# Patient Record
Sex: Female | Born: 2000 | Race: White | Hispanic: No | Marital: Married | Smoking: Never smoker
Health system: Southern US, Community
[De-identification: ages and names within clinical notes are randomized; demographics above are authoritative.]

## PROBLEM LIST (undated history)

## (undated) DIAGNOSIS — F988 Other specified behavioral and emotional disorders with onset usually occurring in childhood and adolescence: Secondary | ICD-10-CM

---
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
## 2021-02-13 ENCOUNTER — Ambulatory Visit (INDEPENDENT_AMBULATORY_CARE_PROVIDER_SITE_OTHER): Payer: Medicaid Other | Admitting: Adult Health

## 2021-02-13 ENCOUNTER — Other Ambulatory Visit: Payer: Self-pay

## 2021-02-13 ENCOUNTER — Encounter: Payer: Self-pay | Admitting: Adult Health

## 2021-02-13 VITALS — BP 127/80 | HR 81 | Ht 61.0 in | Wt 121.2 lb

## 2021-02-13 DIAGNOSIS — O3680X0 Pregnancy with inconclusive fetal viability, not applicable or unspecified: Secondary | ICD-10-CM | POA: Diagnosis not present

## 2021-02-13 DIAGNOSIS — Z3201 Encounter for pregnancy test, result positive: Secondary | ICD-10-CM

## 2021-02-13 DIAGNOSIS — Z3A01 Less than 8 weeks gestation of pregnancy: Secondary | ICD-10-CM | POA: Diagnosis not present

## 2021-02-13 DIAGNOSIS — Z3200 Encounter for pregnancy test, result unknown: Secondary | ICD-10-CM | POA: Insufficient documentation

## 2021-02-13 LAB — POCT URINE PREGNANCY: Preg Test, Ur: POSITIVE — AB

## 2021-02-13 NOTE — Patient Instructions (Signed)
Obstetrics: Normal and Problem Pregnancies (7th ed., pp. 102-121). [place], [place]: Elsevier."> Textbook of Family Medicine (9th ed., pp. 365-410). [place], [place]: [employer].">  First Trimester of Pregnancy  The first trimester of pregnancy starts on the first day of your last menstrual period until the end of week 12. This is months 1 through 3 of pregnancy. A week after a sperm fertilizes an egg, the egg will implant into the wall of the uterus and begin to develop into a baby. By the end of 12 weeks, all the baby's organs will be formed and the baby will be 2-3 inches in size. Body changes during your first trimester Your body goes through many changes during pregnancy. The changes vary and generally return to normal after your baby is born. Physical changes  You may gain or lose weight.  Your breasts may begin to grow larger and become tender. The tissue that surrounds your nipples (areola) may become darker.  Dark spots or blotches (chloasma or mask of pregnancy) may develop on your face.  You may have changes in your hair. These can include thickening or thinning of your hair or changes in texture. Health changes  You may feel nauseous, and you may vomit.  You may have heartburn.  You may develop headaches.  You may develop constipation.  Your gums may bleed and may be sensitive to brushing and flossing. Other changes  You may tire easily.  You may urinate more often.  Your menstrual periods will stop.  You may have a loss of appetite.  You may develop cravings for certain kinds of food.  You may have changes in your emotions from day to day.  You may have more vivid and strange dreams. Follow these instructions at home: Medicines  Follow your health care provider's instructions regarding medicine use. Specific medicines may be either safe or unsafe to take during pregnancy. Do not take any medicines unless told to by your health care provider.  Take a  prenatal vitamin that contains at least 600 micrograms (mcg) of folic acid. Eating and drinking  Eat a healthy diet that includes fresh fruits and vegetables, whole grains, good sources of protein such as meat, eggs, or tofu, and low-fat dairy products.  Avoid raw meat and unpasteurized juice, milk, and cheese. These carry germs that can harm you and your baby.  If you feel nauseous or you vomit: ? Eat 4 or 5 small meals a day instead of 3 large meals. ? Try eating a few soda crackers. ? Drink liquids between meals instead of during meals.  You may need to take these actions to prevent or treat constipation: ? Drink enough fluid to keep your urine pale yellow. ? Eat foods that are high in fiber, such as beans, whole grains, and fresh fruits and vegetables. ? Limit foods that are high in fat and processed sugars, such as fried or sweet foods. Activity  Exercise only as directed by your health care provider. Most people can continue their usual exercise routine during pregnancy. Try to exercise for 30 minutes at least 5 days a week.  Stop exercising if you develop pain or cramping in the lower abdomen or lower back.  Avoid exercising if it is very hot or humid or if you are at high altitude.  Avoid heavy lifting.  If you choose to, you may have sex unless your health care provider tells you not to. Relieving pain and discomfort  Wear a good support bra to relieve breast   tenderness.  Rest with your legs elevated if you have leg cramps or low back pain.  If you develop bulging veins (varicose veins) in your legs: ? Wear support hose as told by your health care provider. ? Elevate your feet for 15 minutes, 3-4 times a day. ? Limit salt in your diet. Safety  Wear your seat belt at all times when driving or riding in a car.  Talk with your health care provider if someone is verbally or physically abusive to you.  Talk with your health care provider if you are feeling sad or have  thoughts of hurting yourself. Lifestyle  Do not use hot tubs, steam rooms, or saunas.  Do not douche. Do not use tampons or scented sanitary pads.  Do not use herbal remedies, alcohol, illegal drugs, or medicines that are not approved by your health care provider. Chemicals in these products can harm your baby.  Do not use any products that contain nicotine or tobacco, such as cigarettes, e-cigarettes, and chewing tobacco. If you need help quitting, ask your health care provider.  Avoid cat litter boxes and soil used by cats. These carry germs that can cause birth defects in the baby and possibly loss of the unborn baby (fetus) by miscarriage or stillbirth. General instructions  During routine prenatal visits in the first trimester, your health care provider will do a physical exam, perform necessary tests, and ask you how things are going. Keep all follow-up visits. This is important.  Ask for help if you have counseling or nutritional needs during pregnancy. Your health care provider can offer advice or refer you to specialists for help with various needs.  Schedule a dentist appointment. At home, brush your teeth with a soft toothbrush. Floss gently.  Write down your questions. Take them to your prenatal visits. Where to find more information  American Pregnancy Association: americanpregnancy.org  American College of Obstetricians and Gynecologists: acog.org/en/Womens%20Health/Pregnancy  Office on Women's Health: womenshealth.gov/pregnancy Contact a health care provider if you have:  Dizziness.  A fever.  Mild pelvic cramps, pelvic pressure, or nagging pain in the abdominal area.  Nausea, vomiting, or diarrhea that lasts for 24 hours or longer.  A bad-smelling vaginal discharge.  Pain when you urinate.  Known exposure to a contagious illness, such as chickenpox, measles, Zika virus, HIV, or hepatitis. Get help right away if you have:  Spotting or bleeding from your  vagina.  Severe abdominal cramping or pain.  Shortness of breath or chest pain.  Any kind of trauma, such as from a fall or a car crash.  New or increased pain, swelling, or redness in an arm or leg. Summary  The first trimester of pregnancy starts on the first day of your last menstrual period until the end of week 12 (months 1 through 3).  Eating 4 or 5 small meals a day rather than 3 large meals may help to relieve nausea and vomiting.  Do not use any products that contain nicotine or tobacco, such as cigarettes, e-cigarettes, and chewing tobacco. If you need help quitting, ask your health care provider.  Keep all follow-up visits. This is important. This information is not intended to replace advice given to you by your health care provider. Make sure you discuss any questions you have with your health care provider. Document Revised: 02/28/2020 Document Reviewed: 01/04/2020 Elsevier Patient Education  2021 Elsevier Inc.  

## 2021-02-13 NOTE — Progress Notes (Signed)
  Subjective:     Patient ID: Lauren Savage, female   DOB: 02/02/2001, 20 y.o.   MRN: [id number]  HPI Tonja is a 20 year old white female, single with SO,originally had appt to discuss fertility and then a week after making appt. Found out she was pregnant. PCP is Dr Laural Benes.   Review of Systems +missed period with +HPTS of 8 or 9 Reviewed past medical,surgical, social and family history. Reviewed medications and allergies.     Objective:   Physical Exam BP 127/80 (BP Location: Right Arm, Patient Position: Sitting, Cuff Size: Normal)   Pulse 81   Ht 5\' 1"  (1.549 m)   Wt 121 lb 3.2 oz (55 kg)   LMP 01/12/2021 (Approximate)   BMI 22.90 kg/m UPT +. About 4+4 weeks by LMP with EDD 10/19/21.Skin warm and dry. Neck: mid line trachea, normal thyroid, good ROM, no lymphadenopathy noted. Lungs: clear to ausculation bilaterally. Cardiovascular: regular rate and rhythm.Abdomen is soft and non tender.  Upstream - 02/13/21 1534      Pregnancy Intention Screening   Does the patient want to become pregnant in the next year? Yes    Does the patient's partner want to become pregnant in the next year? Yes    Would the patient like to discuss contraceptive options today? No      Contraception Wrap Up   Current Method Pregnant/Seeking Pregnancy    End Method Pregnant/Seeking Pregnancy    Contraception Counseling Provided No             Assessment:     1. Possible pregnancy +UPT  2. Less than [redacted] weeks gestation of pregnancy Take OTC PNV  3. Encounter to determine fetal viability of pregnancy, single or unspecified fetus Return in about 4 weeks for dating 04/15/21    Plan:      -note given to limit lifting to <25 lbs, she works for [employer] Review handout on First trimester and by [facility]

## 2021-02-19 ENCOUNTER — Other Ambulatory Visit: Payer: Self-pay

## 2021-02-19 ENCOUNTER — Encounter: Payer: Self-pay | Admitting: *Deleted

## 2021-02-19 ENCOUNTER — Other Ambulatory Visit (INDEPENDENT_AMBULATORY_CARE_PROVIDER_SITE_OTHER): Payer: Medicaid Other | Admitting: *Deleted

## 2021-02-19 DIAGNOSIS — R319 Hematuria, unspecified: Secondary | ICD-10-CM | POA: Diagnosis not present

## 2021-02-19 LAB — POCT URINALYSIS DIPSTICK OB
Blood, UA: NEGATIVE
Glucose, UA: NEGATIVE
Leukocytes, UA: NEGATIVE
Nitrite, UA: NEGATIVE
POC,PROTEIN,UA: NEGATIVE

## 2021-02-19 MED ORDER — PROMETHAZINE HCL 25 MG PO TABS: 25.0000 mg | ORAL_TABLET | Freq: Four times a day (QID) | ORAL | 1 refills | Status: DC | PRN

## 2021-02-19 NOTE — Addendum Note (Signed)
Addended by: Cyril Mourning A on: 02/19/2021 05:14 PM   Modules accepted: Orders

## 2021-02-19 NOTE — Progress Notes (Signed)
   NURSE VISIT- UTI SYMPTOMS   SUBJECTIVE:  Lauren Savage is a 20 y.o. G63P0000 female here for UTI symptoms. She is [redacted]w[redacted]d pregnant. She reports flank pain on the right and hematuria.  C/O worsening nausea and vomiting.  Sleeping a lot.  OBJECTIVE:  LMP 01/12/2021 (Approximate)   Appears well, in no apparent distress  Results for orders placed or performed in visit on 02/19/21 (from the past 24 hour(s))  POC Urinalysis Dipstick OB   Collection Time: 02/19/21  4:28 PM  Result Value Ref Range   Color, UA     Clarity, UA     Glucose, UA Negative Negative   Bilirubin, UA     Ketones, UA small    Spec Grav, UA     Blood, UA neg    pH, UA     POC,PROTEIN,UA Negative Negative, Trace, Small (1+), Moderate (2+), Large (3+), 4+   Urobilinogen, UA     Nitrite, UA neg    Leukocytes, UA Negative Negative   Appearance     Odor      ASSESSMENT: Pregnancy [redacted]w[redacted]d with UTI symptoms and negative nitrites  PLAN: Note routed to Cyril Mourning, AGNP   Rx sent by provider today: Yes  For nausea and vomiting. Will wait for culture results.  Urine culture sent Call or return to clinic prn if these symptoms worsen or fail to improve as anticipated. Follow-up: as scheduled   Jobe Marker  02/19/2021 4:28 PM

## 2021-02-19 NOTE — Progress Notes (Signed)
Chart reviewed for nurse visit. Agree with plan of care. Rx phenergan Adline Potter, NP 02/19/2021 5:13 PM

## 2021-02-20 ENCOUNTER — Telehealth: Payer: Self-pay | Admitting: *Deleted

## 2021-02-20 NOTE — Telephone Encounter (Signed)
Verbal order called in for Phenergan 25mg  PO every 6 hours.

## 2021-02-21 LAB — URINE CULTURE: Organism ID, Bacteria: NO GROWTH

## 2021-03-19 ENCOUNTER — Other Ambulatory Visit: Payer: Medicaid Other

## 2021-04-14 ENCOUNTER — Encounter: Payer: Self-pay | Admitting: Pediatrics

## 2021-05-22 ENCOUNTER — Ambulatory Visit: Payer: Self-pay | Admitting: Adult Health

## 2021-09-01 ENCOUNTER — Ambulatory Visit (INDEPENDENT_AMBULATORY_CARE_PROVIDER_SITE_OTHER): Payer: Medicaid Other | Admitting: Adult Health

## 2021-09-01 ENCOUNTER — Other Ambulatory Visit: Payer: Self-pay

## 2021-09-01 ENCOUNTER — Encounter: Payer: Self-pay | Admitting: Adult Health

## 2021-09-01 VITALS — BP 115/70 | HR 80 | Ht 62.0 in | Wt 114.5 lb

## 2021-09-01 DIAGNOSIS — Z3201 Encounter for pregnancy test, result positive: Secondary | ICD-10-CM | POA: Diagnosis not present

## 2021-09-01 DIAGNOSIS — Z3A01 Less than 8 weeks gestation of pregnancy: Secondary | ICD-10-CM

## 2021-09-01 LAB — POCT URINE PREGNANCY: Preg Test, Ur: POSITIVE — AB

## 2021-09-01 NOTE — Progress Notes (Signed)
  Subjective:     Patient ID: Lauren Savage, female   DOB: 2001-05-12, 20 y.o.   MRN: [id number]  HPI Lauren Savage is a 20 year old white female, married, in for UPT, she has had missed periods and had 3+HPTs. She said she was treated for ectopic pregnancy in [facility] in [place], with a shot,and got pregnant shortly after.  Her mom is with her today.  Review of Systems Has missed periods and had 3+HPTs +urinary frequency and breast tenderness Reviewed past medical,surgical, social and family history. Reviewed medications and allergies.     Objective:   Physical Exam BP 115/70 (BP Location: Left Arm, Patient Position: Sitting, Cuff Size: Normal)   Pulse 80   Ht 5\' 2"  (1.575 m)   Wt 114 lb 8 oz (51.9 kg)   LMP 07/13/2021 (Approximate)   BMI 20.94 kg/m  UPT is +, about 7+1 week by LMP with EDD 04/19/22. Skin warm and dry. Neck: mid line trachea, normal thyroid, good ROM, no lymphadenopathy noted. Lungs: clear to ausculation bilaterally. Cardiovascular: regular rate and rhythm.    Abdomen is soft and non tender, POC 04/21/22 shows IUP with YS and fetal pole with +FHM. Fall risk is low  Upstream - 09/01/21 1400       Pregnancy Intention Screening   Does the patient want to become pregnant in the next year? N/A    Does the patient's partner want to become pregnant in the next year? N/A    Would the patient like to discuss contraceptive options today? N/A      Contraception Wrap Up   Current Method Pregnant/Seeking Pregnancy    End Method Pregnant/Seeking Pregnancy    Contraception Counseling Provided No             Assessment:     1. Pregnancy examination or test, positive result Continue PNV  2. Less than [redacted] weeks gestation of pregnancy     Plan:     Will be seen in 09/03/21 husband in [employer]

## 2023-05-25 ENCOUNTER — Ambulatory Visit: Admitting: Adult Health

## 2023-05-25 ENCOUNTER — Encounter: Payer: Self-pay | Admitting: Adult Health

## 2023-05-25 VITALS — BP 121/78 | HR 90 | Ht 61.25 in | Wt 104.5 lb

## 2023-05-25 DIAGNOSIS — Z3202 Encounter for pregnancy test, result negative: Secondary | ICD-10-CM | POA: Diagnosis not present

## 2023-05-25 DIAGNOSIS — Z30011 Encounter for initial prescription of contraceptive pills: Secondary | ICD-10-CM | POA: Diagnosis not present

## 2023-05-25 LAB — POCT URINE PREGNANCY: Preg Test, Ur: NEGATIVE

## 2023-05-25 MED ORDER — NORETHIN-ETH ESTRAD-FE BIPHAS 1 MG-10 MCG / 10 MCG PO TABS: 1.0000 | ORAL_TABLET | Freq: Every day | ORAL | Status: DC

## 2023-05-25 NOTE — Progress Notes (Signed)
  Subjective:     Patient ID: Lauren Savage, female   DOB: 07-17-2001, 22 y.o.   MRN: [id number]  HPI Lauren Savage is a 22 year old white female, married, G2P0111, in requesting to start BCP. Periods are regular and last about 7 days with some cramps.    Review of Systems Periods are regular with some cramps Last sex 2 days ago Denies any MI.stroke. DVT, breast cancer or migraine with aura Reviewed past medical,surgical, social and family history. Reviewed medications and allergies.     Objective:   Physical Exam BP 121/78 (BP Location: Left Arm, Patient Position: Sitting, Cuff Size: Normal)   Pulse 90   Ht 5' 1.25" (1.556 m)   Wt 104 lb 8 oz (47.4 kg)   LMP 05/06/2023 (Approximate)   Breastfeeding No   BMI 19.58 kg/m  UPT is negative. Skin warm and dry.Lungs: clear to ausculation bilaterally. Cardiovascular: regular rate and rhythm.  Fall risk is low  Upstream - 05/25/23 1415       Pregnancy Intention Screening   Does the patient want to become pregnant in the next year? No    Does the patient's partner want to become pregnant in the next year? No    Would the patient like to discuss contraceptive options today? Yes      Contraception Wrap Up   Current Method No Method - Other Reason    End Method Oral Contraceptive    Contraception Counseling Provided Yes    How was the end contraceptive method provided? Provided on site                Assessment:    1. Pregnancy examination or test, negative result - POCT urine pregnancy  2. Encounter for initial prescription of contraceptive pills   Will give 3 packs lo Loestrin, start with next period and use condoms for 1 pack Will follow up in 3 months for ROS and pap and physical     Meds ordered this encounter  Medications   Norethindrone-Ethinyl Estradiol-Fe Biphas (LO LOESTRIN FE) 1 MG-10 MCG / 10 MCG tablet    Sig: Take 1 tablet by mouth daily.    Order Specific Question:   Supervising Provider    Answer:   Lazaro Arms  [[id number]]    Plan:     Return 08/25/23 for pap and physical and ROS

## 2023-06-28 ENCOUNTER — Other Ambulatory Visit (HOSPITAL_COMMUNITY)
Admission: RE | Admit: 2023-06-28 | Discharge: 2023-06-28 | Disposition: A | Discharge status: Home / Self Care | Source: Ambulatory Visit | Attending: Women's Health | Admitting: Women's Health

## 2023-06-28 ENCOUNTER — Encounter: Payer: Self-pay | Admitting: Adult Health

## 2023-06-28 ENCOUNTER — Ambulatory Visit: Admitting: Adult Health

## 2023-06-28 VITALS — BP 136/82 | Ht 62.0 in | Wt 100.0 lb

## 2023-06-28 DIAGNOSIS — Z124 Encounter for screening for malignant neoplasm of cervix: Secondary | ICD-10-CM | POA: Insufficient documentation

## 2023-06-28 DIAGNOSIS — Z30013 Encounter for initial prescription of injectable contraceptive: Secondary | ICD-10-CM | POA: Diagnosis not present

## 2023-06-28 DIAGNOSIS — Z1331 Encounter for screening for depression: Secondary | ICD-10-CM

## 2023-06-28 DIAGNOSIS — R630 Anorexia: Secondary | ICD-10-CM

## 2023-06-28 DIAGNOSIS — R634 Abnormal weight loss: Secondary | ICD-10-CM

## 2023-06-28 DIAGNOSIS — Z3202 Encounter for pregnancy test, result negative: Secondary | ICD-10-CM

## 2023-06-28 DIAGNOSIS — R4589 Other symptoms and signs involving emotional state: Secondary | ICD-10-CM | POA: Diagnosis not present

## 2023-06-28 LAB — POCT URINE PREGNANCY: Preg Test, Ur: NEGATIVE

## 2023-06-28 MED ORDER — MEDROXYPROGESTERONE ACETATE 150 MG/ML IM SUSP: 150.0000 mg | INTRAMUSCULAR | 4 refills | Status: DC

## 2023-06-28 MED ORDER — MEDROXYPROGESTERONE ACETATE 150 MG/ML IM SUSY
150.0000 mg | PREFILLED_SYRINGE | Freq: Once | INTRAMUSCULAR | Status: AC
Start: 2023-06-28 — End: 2023-06-28
  Administered 2023-06-28: 150 mg via INTRAMUSCULAR

## 2023-06-28 NOTE — Progress Notes (Signed)
Subjective:     Patient ID: Lauren Savage, female   DOB: 2001-06-03, 22 y.o.   MRN: [id number]  HPI Lauren Savage is a 22 year old white female, married, G2P0111, in to discuss birth control, stopped lo Loestrin was moody and lost weight, no appetite. And she needs a pap.  She stopped lo Loestrin after a month.     Review of Systems +moody +no appetite + lost weight with lo Loestrin, about 4.8 lbs  Reviewed past medical,surgical, social and family history. Reviewed medications and allergies.      Objective:   Physical Exam BP 136/82 (BP Location: Left Arm, Patient Position: Sitting, Cuff Size: Normal)   Ht 5\' 2"  (1.575 m)   Wt 100 lb (45.4 kg)   Breastfeeding No   BMI 18.29 kg/m  UPT is negative Skin warm and dry.Pelvic: external genitalia is normal in appearance no lesions, vagina: pink,urethra has no lesions or masses noted, cervix:smooth, pap with GC/CHL performed, uterus: normal size, shape and contour, non tender, no masses felt, adnexa: no masses or tenderness noted. Bladder is non tender and no masses felt.   Fall risk is low    06/28/2023    3:30 PM 03/23/2018   11:42 AM  Depression screen PHQ 2/9  Decreased Interest 2 0  Down, Depressed, Hopeless 0 0  PHQ - 2 Score 2 0  Altered sleeping 1   Tired, decreased energy 3   Change in appetite 3   Feeling bad or failure about yourself  0   Trouble concentrating 0   Moving slowly or fidgety/restless 0   Suicidal thoughts 0   PHQ-9 Score 9        06/28/2023    3:32 PM  GAD 7 : Generalized Anxiety Score  Nervous, Anxious, on Edge 0  Control/stop worrying 2  Worry too much - different things 2  Trouble relaxing 2  Restless 0  Easily annoyed or irritable 2  Afraid - awful might happen 0  Total GAD 7 Score 8    Upstream - 06/28/23 1521       Pregnancy Intention Screening   Does the patient want to become pregnant in the next year? No    Does the patient's partner want to become pregnant in the next year? No    Would the  patient like to discuss contraceptive options today? Yes      Contraception Wrap Up   Current Method No Method - Other Reason    End Method Hormonal Injection    Contraception Counseling Provided Yes    How was the end contraceptive method provided? Provided on site   and rx sent           Examination chaperoned by Malachy Mood LPN   Assessment:     1. Pregnancy examination or test, negative result - POCT urine pregnancy  2. Routine Papanicolaou smear Pap sent Pap in 3 years if normal - Cytology - PAP( [place])  3. Encounter for initial prescription of injectable contraceptive First depo given today in office No sex or use condoms for 2 weeks Rx sent for depo Meds ordered this encounter  Medications   medroxyPROGESTERone (DEPO-PROVERA) 150 MG/ML injection    Sig: Inject 1 mL (150 mg total) into the muscle every 3 (three) months.    Dispense:  1 mL    Refill:  4    Order Specific Question:   Supervising Provider    Answer:   Duane Lope H [[id number]]  4. Moody Was moody on lo Loestrin   5. No appetite Had no appetite with Lo Loestrin   6. Loss of weight Lost 4.8 lbs with lo Loestrin     Plan:     Follow up in 12 weeks for depo and see me

## 2023-06-28 NOTE — Patient Instructions (Signed)
Use condoms or no sex for 2 weeks

## 2023-06-28 NOTE — Addendum Note (Signed)
Addended by: Colen Darling on: 06/28/2023 04:44 PM   Modules accepted: Orders

## 2023-06-29 ENCOUNTER — Ambulatory Visit: Admitting: Women's Health

## 2023-07-02 LAB — CYTOLOGY - PAP
Chlamydia: NEGATIVE
Comment: NEGATIVE
Comment: NORMAL
Neisseria Gonorrhea: NEGATIVE

## 2023-07-05 ENCOUNTER — Encounter: Payer: Self-pay | Admitting: Adult Health

## 2023-07-05 DIAGNOSIS — R87612 Low grade squamous intraepithelial lesion on cytologic smear of cervix (LGSIL): Secondary | ICD-10-CM | POA: Insufficient documentation

## 2023-08-05 ENCOUNTER — Ambulatory Visit: Payer: Self-pay | Admitting: Adult Health

## 2023-08-05 ENCOUNTER — Encounter: Payer: Self-pay | Admitting: Adult Health

## 2023-08-05 VITALS — BP 119/79 | HR 97 | Ht 62.0 in | Wt 100.5 lb

## 2023-08-05 DIAGNOSIS — L659 Nonscarring hair loss, unspecified: Secondary | ICD-10-CM

## 2023-08-05 DIAGNOSIS — R42 Dizziness and giddiness: Secondary | ICD-10-CM

## 2023-08-05 DIAGNOSIS — R634 Abnormal weight loss: Secondary | ICD-10-CM

## 2023-08-05 DIAGNOSIS — T148XXA Other injury of unspecified body region, initial encounter: Secondary | ICD-10-CM

## 2023-08-05 NOTE — Progress Notes (Signed)
  Subjective:     Patient ID: Lauren Savage, female   DOB: [birth year], 22 y.o.   MRN: [id number]  HPI Lauren Savage is a 22 year old white female, married, G2P0111, in complaining of bruising and hair loss and weight loss and feels dizzy at times, and has itching on legs and neck. She wonders if related to depo, just got 06/28/23.  And she saw that depo had a lawsuit, will not take next injection.     Component Value Date/Time   DIAGPAP - Low grade squamous intraepithelial lesion (LSIL) (A) 06/28/2023 1524   ADEQPAP  06/28/2023 1524    Satisfactory for evaluation; transformation zone component PRESENT.    Review of Systems + bruising  + hair loss  +weight loss  feels dizzy at times,  has itching on legs and neck. Reviewed past medical,surgical, social and family history. Reviewed medications and allergies.     Objective:   Physical Exam BP 119/79 (BP Location: Left Arm, Patient Position: Sitting, Cuff Size: Normal)   Pulse 97   Ht 5\' 2"  (1.575 m)   Wt 100 lb 8 oz (45.6 kg)   LMP 07/29/2023   Breastfeeding No   BMI 18.38 kg/m     Skin warm and dry. Lungs: clear to ausculation bilaterally. Cardiovascular: regular rate and rhythm. Has fading bruises on legs.  Upstream - 08/05/23 1409       Pregnancy Intention Screening   Does the patient want to become pregnant in the next year? Yes    Does the patient's partner want to become pregnant in the next year? Yes    Would the patient like to discuss contraceptive options today? No      Contraception Wrap Up   Current Method Hormonal Injection    End Method Withdrawal or Other Method    Contraception Counseling Provided Yes             Assessment:     1. Bruising Has random bruises Will check labs  - CBC w/Diff - Comprehensive metabolic panel  2. Hair loss +hair loss Will check thyroid labs - TSH + free T4  3. Weight loss Had weight loss on lo loestrin and has gained 1/2 lbs - TSH + free T4 - Comprehensive metabolic  panel  4. Dizzy spells +dizzy at times  - CBC w/Diff - Comprehensive metabolic panel     Plan:     Follow up  prn

## 2023-08-06 LAB — CBC WITH DIFFERENTIAL/PLATELET
Basophils Absolute: 0 10*3/uL (ref 0.0–0.2)
Basos: 0 %
EOS (ABSOLUTE): 0 10*3/uL (ref 0.0–0.4)
Eos: 0 %
Hematocrit: 40.2 % (ref 34.0–46.6)
Hemoglobin: 13.4 g/dL (ref 11.1–15.9)
Immature Grans (Abs): 0 10*3/uL (ref 0.0–0.1)
Immature Granulocytes: 0 %
Lymphocytes Absolute: 1.9 10*3/uL (ref 0.7–3.1)
Lymphs: 25 %
MCH: 30.7 pg (ref 26.6–33.0)
MCHC: 33.3 g/dL (ref 31.5–35.7)
MCV: 92 fL (ref 79–97)
Monocytes Absolute: 0.5 10*3/uL (ref 0.1–0.9)
Monocytes: 6 %
Neutrophils Absolute: 5.1 10*3/uL (ref 1.4–7.0)
Neutrophils: 69 %
Platelets: 267 10*3/uL (ref 150–450)
RBC: 4.36 x10E6/uL (ref 3.77–5.28)
RDW: 12.7 % (ref 11.7–15.4)
WBC: 7.5 10*3/uL (ref 3.4–10.8)

## 2023-08-06 LAB — COMPREHENSIVE METABOLIC PANEL
ALT: 13 [IU]/L (ref 0–32)
AST: 19 [IU]/L (ref 0–40)
Albumin: 5.1 g/dL — ABNORMAL HIGH (ref 4.0–5.0)
Alkaline Phosphatase: 57 [IU]/L (ref 44–121)
BUN/Creatinine Ratio: 8 — ABNORMAL LOW (ref 9–23)
BUN: 7 mg/dL (ref 6–20)
Bilirubin Total: 0.5 mg/dL (ref 0.0–1.2)
CO2: 21 mmol/L (ref 20–29)
Calcium: 10 mg/dL (ref 8.7–10.2)
Chloride: 107 mmol/L — ABNORMAL HIGH (ref 96–106)
Creatinine, Ser: 0.92 mg/dL (ref 0.57–1.00)
Globulin, Total: 2.6 g/dL (ref 1.5–4.5)
Glucose: 96 mg/dL (ref 70–99)
Potassium: 4.1 mmol/L (ref 3.5–5.2)
Sodium: 146 mmol/L — ABNORMAL HIGH (ref 134–144)
Total Protein: 7.7 g/dL (ref 6.0–8.5)
eGFR: 91 mL/min/{1.73_m2} (ref >59)

## 2023-08-06 LAB — TSH+FREE T4
Free T4: 1.42 ng/dL (ref 0.82–1.77)
TSH: 0.672 u[IU]/mL (ref 0.450–4.500)

## 2023-08-09 ENCOUNTER — Telehealth: Payer: Self-pay | Admitting: Adult Health

## 2023-08-09 NOTE — Telephone Encounter (Signed)
Pt aware of labs, will probably stay with depo she says, next appt 09/20/23.

## 2023-08-24 ENCOUNTER — Telehealth: Payer: Self-pay

## 2023-08-24 NOTE — Telephone Encounter (Signed)
Called patient about the appointment she has tomorrow for an annual. Nurse messaged me saying patient had one on 06/28/23. She wanted me to see if she was needing the appointment for other reasons. No answer on either phones.

## 2023-08-25 ENCOUNTER — Ambulatory Visit: Admitting: Adult Health

## 2023-09-20 ENCOUNTER — Encounter: Payer: Self-pay | Admitting: Adult Health

## 2023-09-20 ENCOUNTER — Ambulatory Visit

## 2023-09-20 ENCOUNTER — Ambulatory Visit: Admitting: Adult Health

## 2023-09-20 VITALS — BP 113/72 | HR 81 | Ht 62.0 in | Wt 106.0 lb

## 2023-09-20 DIAGNOSIS — R300 Dysuria: Secondary | ICD-10-CM | POA: Diagnosis not present

## 2023-09-20 DIAGNOSIS — Z3042 Encounter for surveillance of injectable contraceptive: Secondary | ICD-10-CM | POA: Diagnosis not present

## 2023-09-20 LAB — POCT URINALYSIS DIPSTICK OB
Glucose, UA: NEGATIVE
Ketones, UA: NEGATIVE
Nitrite, UA: NEGATIVE
POC,PROTEIN,UA: NEGATIVE

## 2023-09-20 MED ORDER — PHENAZOPYRIDINE HCL 200 MG PO TABS: 200.0000 mg | ORAL_TABLET | Freq: Three times a day (TID) | ORAL | 0 refills | Status: DC | PRN

## 2023-09-20 MED ORDER — SULFAMETHOXAZOLE-TRIMETHOPRIM 800-160 MG PO TABS: 1.0000 | ORAL_TABLET | Freq: Two times a day (BID) | ORAL | 0 refills | Status: DC

## 2023-09-20 NOTE — Progress Notes (Signed)
  Subjective:     Patient ID: Lauren Savage, female   DOB: 2000-10-08, 22 y.o.   MRN: [id number]  HPI Lauren Savage is a 22 year old white female, married, G2P0111, in complaining of pain with urination and back pain, did not bring depo, will get Aunt who is RN to give it to her. She says she gets UTIs often.     Component Value Date/Time   DIAGPAP - Low grade squamous intraepithelial lesion (LSIL) (A) 06/28/2023 1524   ADEQPAP  06/28/2023 1524    Satisfactory for evaluation; transformation zone component PRESENT.    Review of Systems pain with urination and back pain, Reviewed past medical,surgical, social and family history. Reviewed medications and allergies.      Objective:   Physical Exam BP 113/72 (BP Location: Right Arm, Patient Position: Sitting)   Pulse 81   Ht 5\' 2"  (1.575 m)   Wt 106 lb (48.1 kg)   BMI 19.39 kg/m  urine dipstick showed trace blood and 1+ leuks Skin warm and dry. Lungs: clear to ausculation bilaterally. Cardiovascular: regular rate and rhythm.  +CVAT on the right she says.  [facility] - 09/20/23 1517       Pregnancy Intention Screening   Does the patient want to become pregnant in the next year? No    Does the patient's partner want to become pregnant in the next year? No    Would the patient like to discuss contraceptive options today? No      Contraception Wrap Up   Current Method Hormonal Injection    End Method Hormonal Injection    Contraception Counseling Provided No                Assessment:     1. Dysuria (Primary) + pain with urination Will send UA C&S  Rx sent in for septra ds and pyridium Meds ordered this encounter  Medications   sulfamethoxazole-trimethoprim (BACTRIM DS) 800-160 MG tablet    Sig: Take 1 tablet by mouth 2 (two) times daily. Take 1 bid    Dispense:  14 tablet    Refill:  0    Supervising Provider:   Duane Lope H [[id number]]   phenazopyridine (PYRIDIUM) 200 MG tablet    Sig: Take 1 tablet (200 mg total) by mouth 3  (three) times daily as needed for pain.    Dispense:  10 tablet    Refill:  0    Supervising Provider:   Lazaro Arms [[id number]]   Pee before and after sex No tub baths, no thong panties for now  Push fluids  - POC Urinalysis Dipstick OB - Urine Culture - Urinalysis  2. Encounter for surveillance of injectable contraceptive Has rx for depo will get aunt to give to her Make sure to get every 12 weeks      Plan:     Follow up prn

## 2023-09-21 LAB — URINALYSIS
Bilirubin, UA: NEGATIVE
Glucose, UA: NEGATIVE
Ketones, UA: NEGATIVE
Nitrite, UA: NEGATIVE
RBC, UA: NEGATIVE
Specific Gravity, UA: 1.02 (ref 1.005–1.030)
Urobilinogen, Ur: 1 mg/dL (ref 0.2–1.0)
pH, UA: 6.5 (ref 5.0–7.5)

## 2023-09-22 LAB — URINE CULTURE

## 2024-11-09 ENCOUNTER — Encounter: Payer: Self-pay | Admitting: Adult Health

## 2024-11-09 ENCOUNTER — Ambulatory Visit: Admitting: Adult Health

## 2024-11-09 VITALS — BP 116/70 | HR 90 | Ht 62.0 in | Wt 119.5 lb

## 2024-11-09 DIAGNOSIS — N926 Irregular menstruation, unspecified: Secondary | ICD-10-CM | POA: Insufficient documentation

## 2024-11-09 DIAGNOSIS — Z124 Encounter for screening for malignant neoplasm of cervix: Secondary | ICD-10-CM | POA: Insufficient documentation

## 2024-11-09 DIAGNOSIS — N739 Female pelvic inflammatory disease, unspecified: Secondary | ICD-10-CM | POA: Insufficient documentation

## 2024-11-09 DIAGNOSIS — R112 Nausea with vomiting, unspecified: Secondary | ICD-10-CM | POA: Insufficient documentation

## 2024-11-09 DIAGNOSIS — R102 Pelvic and perineal pain unspecified side: Secondary | ICD-10-CM | POA: Insufficient documentation

## 2024-11-09 DIAGNOSIS — Z3202 Encounter for pregnancy test, result negative: Secondary | ICD-10-CM | POA: Insufficient documentation

## 2024-11-09 DIAGNOSIS — Z8742 Personal history of other diseases of the female genital tract: Secondary | ICD-10-CM | POA: Insufficient documentation

## 2024-11-09 LAB — POCT URINE PREGNANCY: Preg Test, Ur: NEGATIVE

## 2024-11-09 MED ORDER — CEFTRIAXONE SODIUM 500 MG IJ SOLR
500.0000 mg | Freq: Once | INTRAMUSCULAR | Status: AC
  Administered 2024-11-09: 500 mg via INTRAMUSCULAR

## 2024-11-09 MED ORDER — DOXYCYCLINE HYCLATE 100 MG PO TABS: 100.0000 mg | ORAL_TABLET | Freq: Two times a day (BID) | ORAL | 0 refills | Status: AC

## 2024-11-09 MED ORDER — ONDANSETRON 8 MG PO TBDP: 8.0000 mg | ORAL_TABLET | Freq: Three times a day (TID) | ORAL | 1 refills | Status: AC | PRN

## 2024-11-09 MED ORDER — METRONIDAZOLE 500 MG PO TABS: 500.0000 mg | ORAL_TABLET | Freq: Two times a day (BID) | ORAL | 0 refills | Status: AC

## 2024-11-09 NOTE — Addendum Note (Signed)
 Addended by: NEYSA CLARITA RAMAN on: 11/09/2024 04:33 PM   Modules accepted: Orders

## 2024-11-09 NOTE — Progress Notes (Signed)
 " Subjective:     Patient ID: Lauren Savage, female   DOB: August 21, 2001, 24 y.o.   MRN: [id number]  HPI Lauren Savage is a 24 year old white female, married, G2P0111, in complaining of bleeding black for 8-9 days with clots and cramps, and has had nausea and vomiting. Vagina feels irritated. And she needs a pap. Husband slept with someone in November.    Component Value Date/Time   DIAGPAP - Low grade squamous intraepithelial lesion (LSIL) (A) 06/28/2023 1524   ADEQPAP  06/28/2023 1524    Satisfactory for evaluation; transformation zone component PRESENT.     Review of Systems +bleeding black for 8-9 days with clots and cramping +nausea and vomiting Denis any fever  Vagina feels irritated Has had pain with sex Decreased sex drive Reviewed past medical,surgical, social and family history. Reviewed medications and allergies.     Objective:   Physical Exam BP 116/70 (BP Location: Left Arm, Patient Position: Sitting, Cuff Size: Normal)   Pulse 90   Ht 5' 2 (1.575 m)   Wt 119 lb 8 oz (54.2 kg)   BMI 21.86 kg/m  UPT is negative  Skin warm and dry.Pelvic: external genitalia is normal in appearance no lesions, vagina: dark brown to black blood, no odor,urethra has no lesions or masses noted, cervix:smooth and bulbous, pap with GC/CHL and HR HPV genotyping performed, +CMT, uterus: normal size, shape and contour, + tender, no masses felt, adnexa: no masses or tenderness noted. Bladder is non tender and no masses felt.  Fall risk is low  [facility] - 11/09/24 1456       Pregnancy Intention Screening   Does the patient want to become pregnant in the next year? No    Does the patient's partner want to become pregnant in the next year? No      Contraception Wrap Up   Current Method Abstinence    End Method Abstinence    Contraception Counseling Provided No         Examination chaperoned by Clarita Salt LPN    Assessment:     1. Negative pregnancy test - POCT urine pregnancy  2. Routine  Papanicolaou smear Pap sent  - Cytology - PAP( [facility])  3. Irregular bleeding Bleeding black for 8-9 days  Will check labs  - CBC w/Diff - Comprehensive metabolic panel with GFR  4. Pelvic cramping +cramps  5. Nausea and vomiting, unspecified vomiting type Has had nausea and vomiting  Rx zofran , take before taking meds if needed  - Comprehensive metabolic panel with GFR  6. PID (pelvic inflammatory disease) (Primary) +CMT, gave Rocephin  500 mg IM in office and waited 15 minutes  Will rx doxycycline  100 mg 1 bid x 14 days and flagyl  500 mg 1 bid x 14 days no sex or alcohol Meds ordered this encounter  Medications   ondansetron  (ZOFRAN -ODT) 8 MG disintegrating tablet    Sig: Take 1 tablet (8 mg total) by mouth every 8 (eight) hours as needed for nausea or vomiting.    Dispense:  30 tablet    Refill:  1    Supervising Provider:   JAYNE MINDER H [[id number]]   doxycycline  (VIBRA -TABS) 100 MG tablet    Sig: Take 1 tablet (100 mg total) by mouth 2 (two) times daily.    Dispense:  28 tablet    Refill:  0    Supervising Provider:   JAYNE MINDER H [[id number]]   metroNIDAZOLE  (FLAGYL ) 500 MG tablet    Sig: Take 1 tablet (  500 mg total) by mouth 2 (two) times daily.    Dispense:  28 tablet    Refill:  0    Supervising Provider:   JAYNE, LUTHER H [[id number]]     7. History of abnormal cervical Pap smear Pap sent     Plan:     Follow up with me 11/27/24    "

## 2024-11-10 ENCOUNTER — Ambulatory Visit: Payer: Self-pay | Admitting: Adult Health

## 2024-11-10 LAB — CBC WITH DIFFERENTIAL/PLATELET
Basophils Absolute: 0.1 10*3/uL (ref 0.0–0.2)
Basos: 1 %
EOS (ABSOLUTE): 0 10*3/uL (ref 0.0–0.4)
Eos: 1 %
Hematocrit: 39.4 % (ref 34.0–46.6)
Hemoglobin: 13.3 g/dL (ref 11.1–15.9)
Immature Grans (Abs): 0 10*3/uL (ref 0.0–0.1)
Immature Granulocytes: 0 %
Lymphocytes Absolute: 2 10*3/uL (ref 0.7–3.1)
Lymphs: 28 %
MCH: 30.4 pg (ref 26.6–33.0)
MCHC: 33.8 g/dL (ref 31.5–35.7)
MCV: 90 fL (ref 79–97)
Monocytes Absolute: 0.6 10*3/uL (ref 0.1–0.9)
Monocytes: 9 %
Neutrophils Absolute: 4.3 10*3/uL (ref 1.4–7.0)
Neutrophils: 61 %
Platelets: 303 10*3/uL (ref 150–450)
RBC: 4.38 x10E6/uL (ref 3.77–5.28)
RDW: 12.3 % (ref 11.7–15.4)
WBC: 7 10*3/uL (ref 3.4–10.8)

## 2024-11-10 LAB — COMPREHENSIVE METABOLIC PANEL WITH GFR
ALT: 13 [IU]/L (ref 0–32)
AST: 18 [IU]/L (ref 0–40)
Albumin: 4.8 g/dL (ref 4.0–5.0)
Alkaline Phosphatase: 67 [IU]/L (ref 41–116)
BUN/Creatinine Ratio: 10 (ref 9–23)
BUN: 9 mg/dL (ref 6–20)
Bilirubin Total: 0.4 mg/dL (ref 0.0–1.2)
CO2: 22 mmol/L (ref 20–29)
Calcium: 9.6 mg/dL (ref 8.7–10.2)
Chloride: 102 mmol/L (ref 96–106)
Creatinine, Ser: 0.88 mg/dL (ref 0.57–1.00)
Globulin, Total: 2.9 g/dL (ref 1.5–4.5)
Glucose: 70 mg/dL (ref 70–99)
Potassium: 3.9 mmol/L (ref 3.5–5.2)
Sodium: 141 mmol/L (ref 134–144)
Total Protein: 7.7 g/dL (ref 6.0–8.5)
eGFR: 95 mL/min/{1.73_m2}

## 2024-11-27 ENCOUNTER — Ambulatory Visit: Admitting: Adult Health
